# Patient Record
Sex: Male | Born: 1960 | Race: Black or African American | Hispanic: No | Marital: Married | Smoking: Never smoker
Health system: Southern US, Community
[De-identification: ages and names within clinical notes are randomized; demographics above are authoritative.]

## PROBLEM LIST (undated history)

## (undated) DIAGNOSIS — K219 Gastro-esophageal reflux disease without esophagitis: Secondary | ICD-10-CM

## (undated) DIAGNOSIS — I10 Essential (primary) hypertension: Secondary | ICD-10-CM

## (undated) HISTORY — PX: CHOLECYSTECTOMY: SHX55

---

## 2017-10-02 ENCOUNTER — Encounter (HOSPITAL_BASED_OUTPATIENT_CLINIC_OR_DEPARTMENT_OTHER): Payer: Self-pay | Admitting: Emergency Medicine

## 2017-10-02 DIAGNOSIS — I10 Essential (primary) hypertension: Secondary | ICD-10-CM | POA: Insufficient documentation

## 2017-10-02 DIAGNOSIS — Z79899 Other long term (current) drug therapy: Secondary | ICD-10-CM | POA: Diagnosis not present

## 2017-10-02 DIAGNOSIS — R0789 Other chest pain: Secondary | ICD-10-CM | POA: Diagnosis present

## 2017-10-02 DIAGNOSIS — K567 Ileus, unspecified: Secondary | ICD-10-CM | POA: Diagnosis not present

## 2017-10-02 NOTE — ED Triage Notes (Addendum)
PT presents with c/o abdominal pain since 3pm today. No n/v/d. Pt states he has not taken zantac in a couple weeks.

## 2017-10-03 ENCOUNTER — Emergency Department (HOSPITAL_BASED_OUTPATIENT_CLINIC_OR_DEPARTMENT_OTHER)
Admission: EM | Admit: 2017-10-03 | Discharge: 2017-10-03 | Disposition: A | Discharge status: Home / Self Care | Payer: Medicaid - Out of State | Attending: Physician Assistant | Admitting: Physician Assistant

## 2017-10-03 ENCOUNTER — Emergency Department (HOSPITAL_BASED_OUTPATIENT_CLINIC_OR_DEPARTMENT_OTHER): Payer: Medicaid - Out of State

## 2017-10-03 DIAGNOSIS — K567 Ileus, unspecified: Secondary | ICD-10-CM

## 2017-10-03 HISTORY — DX: Essential (primary) hypertension: I10

## 2017-10-03 LAB — COMPREHENSIVE METABOLIC PANEL
ALT: 35 U/L (ref 17–63)
ANION GAP: 9 (ref 5–15)
AST: 29 U/L (ref 15–41)
Albumin: 4.6 g/dL (ref 3.5–5.0)
Alkaline Phosphatase: 51 U/L (ref 38–126)
BILIRUBIN TOTAL: 0.5 mg/dL (ref 0.3–1.2)
BUN: 11 mg/dL (ref 6–20)
CALCIUM: 9.7 mg/dL (ref 8.9–10.3)
CO2: 28 mmol/L (ref 22–32)
Chloride: 101 mmol/L (ref 101–111)
Creatinine, Ser: 1.05 mg/dL (ref 0.61–1.24)
GFR calc Af Amer: >60 mL/min (ref >60)
Glucose, Bld: 142 mg/dL — ABNORMAL HIGH (ref 65–99)
POTASSIUM: 3.7 mmol/L (ref 3.5–5.1)
SODIUM: 138 mmol/L (ref 135–145)
TOTAL PROTEIN: 8.7 g/dL — AB (ref 6.5–8.1)

## 2017-10-03 LAB — CBC WITH DIFFERENTIAL/PLATELET
BASOS ABS: 0 10*3/uL (ref 0.0–0.1)
BASOS PCT: 0 %
EOS PCT: 0 %
Eosinophils Absolute: 0 10*3/uL (ref 0.0–0.7)
HEMATOCRIT: 43.8 % (ref 39.0–52.0)
Hemoglobin: 14.2 g/dL (ref 13.0–17.0)
LYMPHS PCT: 30 %
Lymphs Abs: 4.3 10*3/uL — ABNORMAL HIGH (ref 0.7–4.0)
MCH: 28.7 pg (ref 26.0–34.0)
MCHC: 32.4 g/dL (ref 30.0–36.0)
MCV: 88.7 fL (ref 78.0–100.0)
Monocytes Absolute: 1.1 10*3/uL — ABNORMAL HIGH (ref 0.1–1.0)
Monocytes Relative: 7 %
NEUTROS ABS: 8.9 10*3/uL — AB (ref 1.7–7.7)
Neutrophils Relative %: 63 %
PLATELETS: 406 10*3/uL — AB (ref 150–400)
RBC: 4.94 MIL/uL (ref 4.22–5.81)
RDW: 11.9 % (ref 11.5–15.5)
WBC: 14.3 10*3/uL — AB (ref 4.0–10.5)

## 2017-10-03 LAB — LIPASE, BLOOD: Lipase: 30 U/L (ref 11–51)

## 2017-10-03 MED ORDER — ONDANSETRON HCL 4 MG/2ML IJ SOLN
INTRAMUSCULAR | Status: AC
  Administered 2017-10-03: 4 mg via INTRAVENOUS
  Filled 2017-10-03: qty 2

## 2017-10-03 MED ORDER — ONDANSETRON HCL 4 MG/2ML IJ SOLN
4.0000 mg | Freq: Once | INTRAMUSCULAR | Status: AC
  Administered 2017-10-03: 4 mg via INTRAVENOUS

## 2017-10-03 MED ORDER — SODIUM CHLORIDE 0.9 % IV BOLUS (SEPSIS)
1000.0000 mL | Freq: Once | INTRAVENOUS | Status: AC
  Administered 2017-10-03: 1000 mL via INTRAVENOUS

## 2017-10-03 MED ORDER — PANTOPRAZOLE SODIUM 40 MG IV SOLR
40.0000 mg | Freq: Once | INTRAVENOUS | Status: AC
  Administered 2017-10-03: 40 mg via INTRAVENOUS

## 2017-10-03 MED ORDER — FENTANYL CITRATE (PF) 100 MCG/2ML IJ SOLN
25.0000 ug | Freq: Once | INTRAMUSCULAR | Status: AC
  Administered 2017-10-03: 25 ug via INTRAVENOUS
  Filled 2017-10-03: qty 2

## 2017-10-03 MED ORDER — IOPAMIDOL (ISOVUE-300) INJECTION 61%
100.0000 mL | Freq: Once | INTRAVENOUS | Status: AC | PRN
  Administered 2017-10-03: 100 mL via INTRAVENOUS

## 2017-10-03 MED ORDER — PANTOPRAZOLE SODIUM 40 MG IV SOLR
INTRAVENOUS | Status: AC
  Administered 2017-10-03: 40 mg via INTRAVENOUS
  Filled 2017-10-03: qty 40

## 2017-10-03 NOTE — ED Notes (Signed)
Tolerating POs, denies pain, "feel better".
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
## 2017-10-03 NOTE — Discharge Instructions (Signed)
We think that you have a  ileus today.  However we cannot exclude that you have complete obstruction.  You are able to tolerate liquids here which is a great sign.  If you start vomiting again with increased pain please return immediately to the emergency department as you may need surgery.  Otherwise please advance your liquid diet slowly as tolerated.
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
## 2017-10-03 NOTE — ED Notes (Signed)
No changes, alert, NAD, calm, to CT.

## 2019-02-09 IMAGING — CT CT ABD-PELV W/ CM
2 of 5 series · 15 of 46 positions shown, 17 images · IV contrast (APPLIED)
Comparison: None.

CLINICAL DATA: Acute onset of generalized abdominal pain and
leukocytosis. Initial encounter.

EXAM:
CT ABDOMEN AND PELVIS WITH CONTRAST
TECHNIQUE: Multidetector CT imaging of the abdomen and pelvis was performed
using the standard protocol following bolus administration of
intravenous contrast.
CONTRAST:  100mL [id number]-SGG IOPAMIDOL (16I5TQ-SGG) INJECTION 61%

[Series 2: axial st · axial · 0.82mm/px · z∈[+741,+1141]mm · 12 of 92 slices shown, 14 images]
[im 6/92  soft-tissue]
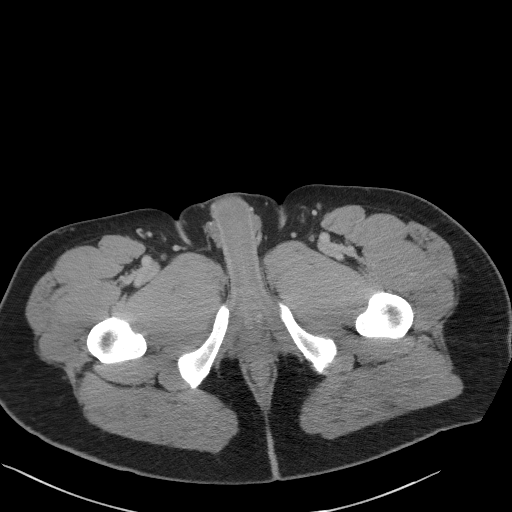
[im 6/92  bone]
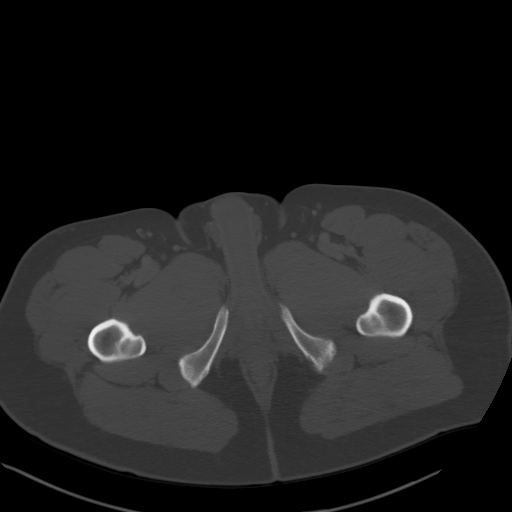
[im 16/92  soft-tissue]
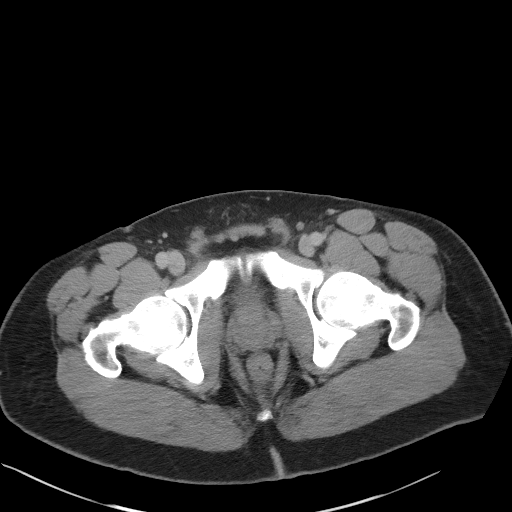
[im 21/92  soft-tissue]
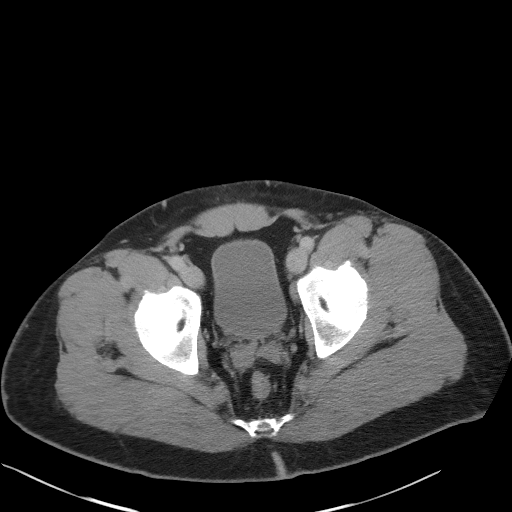
[im 26/92  soft-tissue]
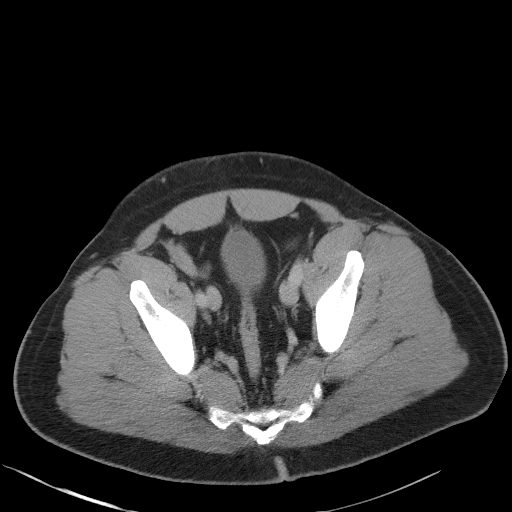
[im 36/92  soft-tissue]
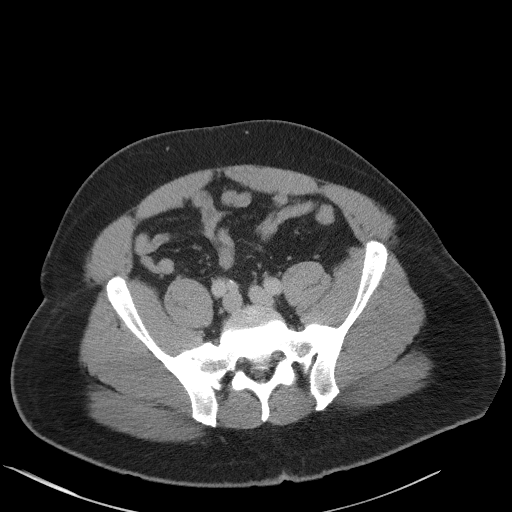
[im 41/92  soft-tissue]
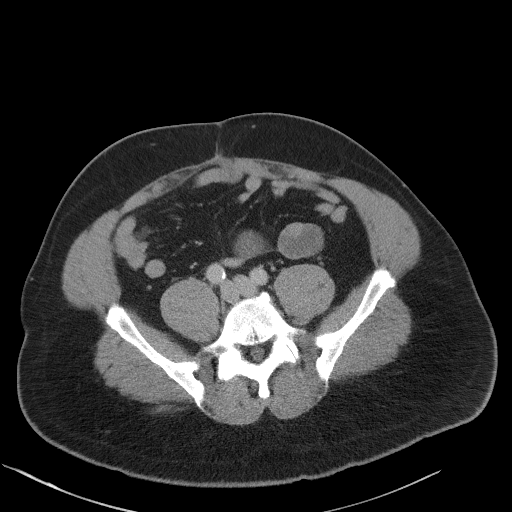
[im 51/92  soft-tissue]
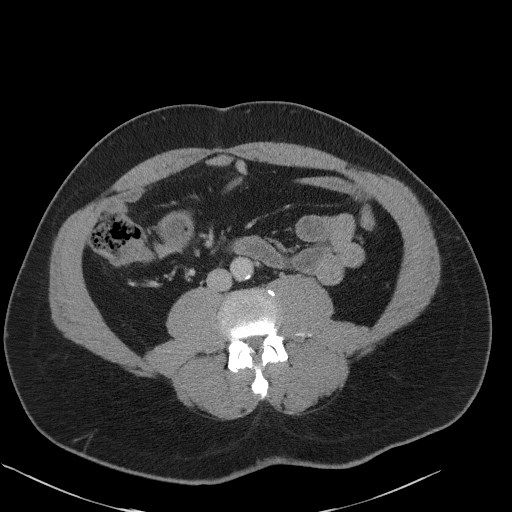
[im 56/92  soft-tissue]
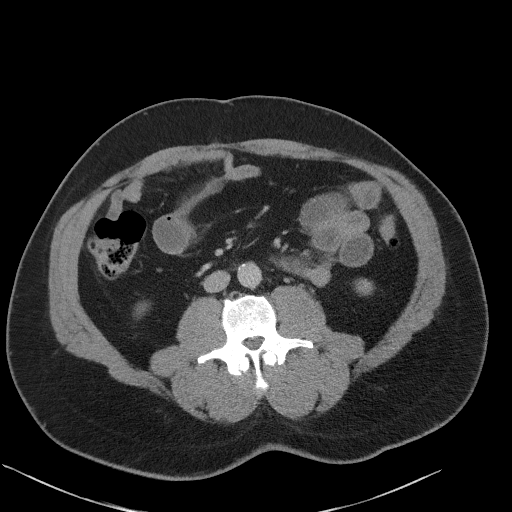
[im 66/92  soft-tissue]
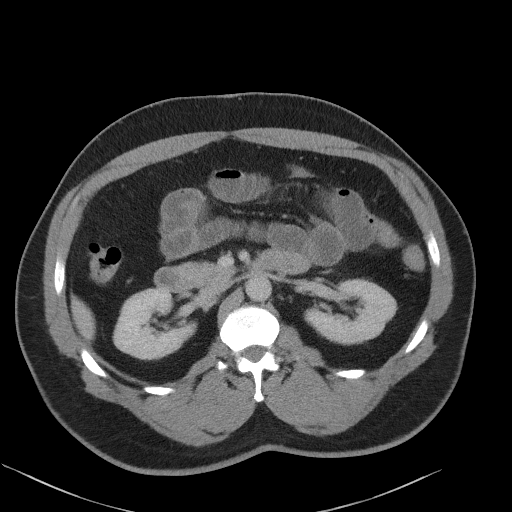
[im 66/92  bone]
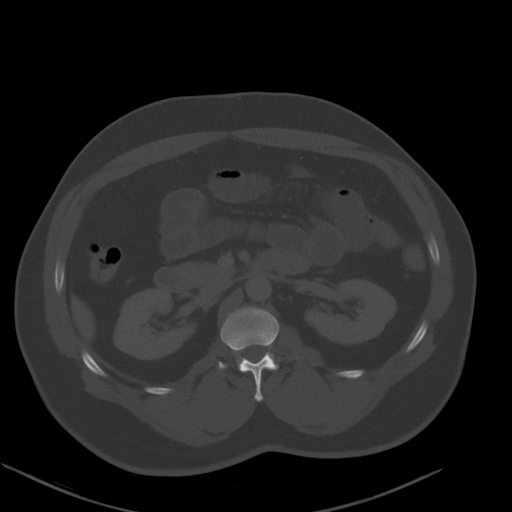
[im 71/92  soft-tissue]
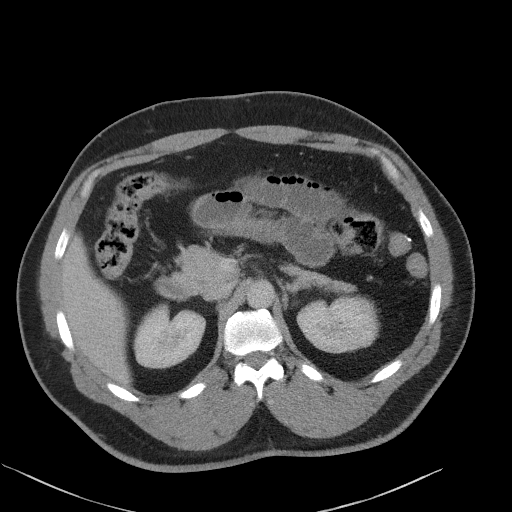
[im 76/92  soft-tissue]
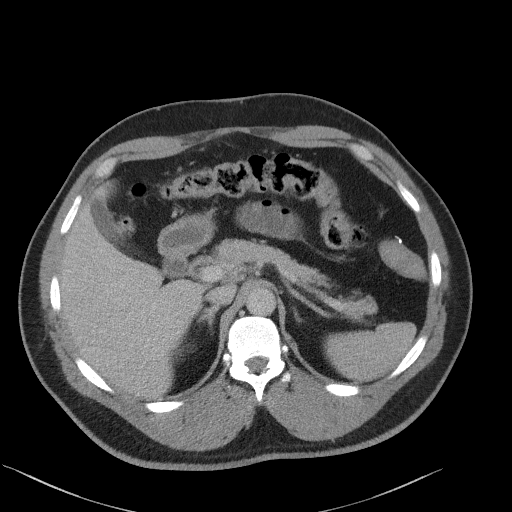
[im 86/92  soft-tissue]
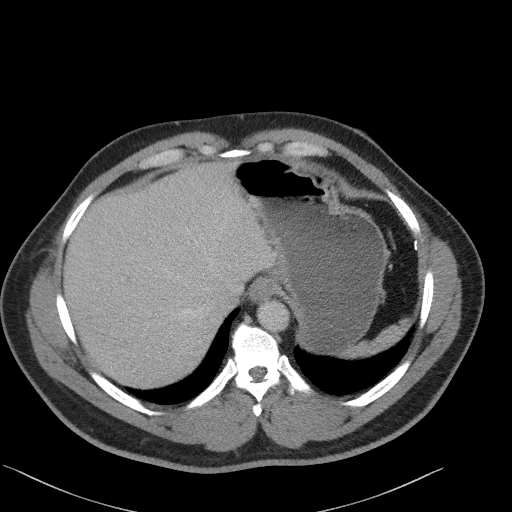

[Series 5: coronal st · coronal · 0.80mm/px · 3 of 107 slices shown]
[im 36/107  soft-tissue]
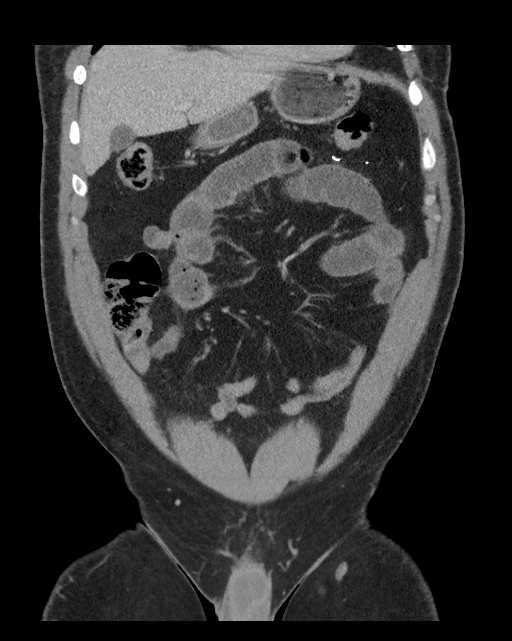
[im 48/107  soft-tissue]
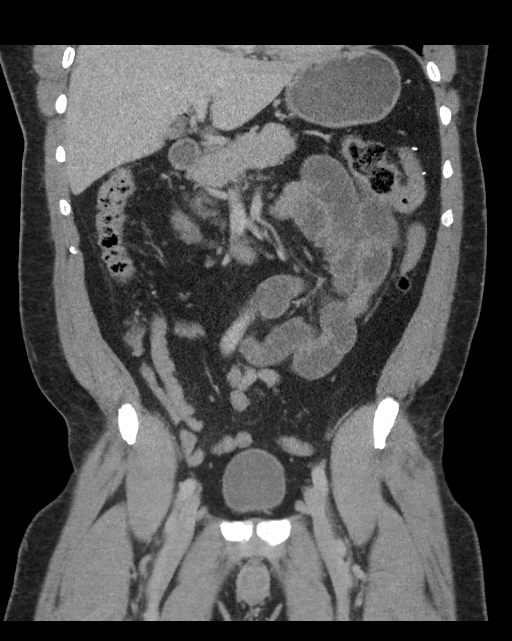
[im 59/107  soft-tissue]
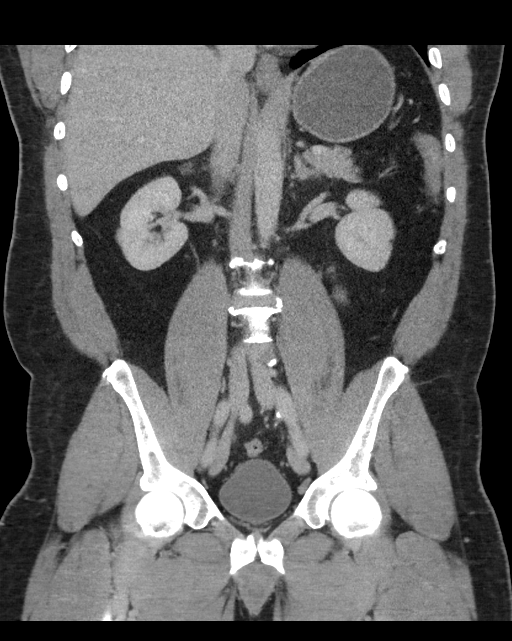

[15 of 46 positions shown; findings below may reference images not displayed]

FINDINGS: Lower chest: The visualized lung bases are grossly clear. The
visualized portions of the mediastinum are unremarkable.

Hepatobiliary: The visualized portions of the liver are
unremarkable. Small stones are seen within the gallbladder. The
gallbladder is otherwise unremarkable. The common bile duct remains
normal in caliber.

Pancreas: The pancreas is within normal limits.

Spleen: The spleen is unremarkable in appearance.

Adrenals/Urinary Tract: The adrenal glands are unremarkable in
appearance. The kidneys are within normal limits. There is no
evidence of hydronephrosis. No renal or ureteral stones are
identified. No perinephric stranding is seen.

Stomach/Bowel: The stomach is unremarkable in appearance. The
appendix is normal in caliber, without evidence of appendicitis. The
colon is unremarkable in appearance.

There is borderline distention of small-bowel loops to 3.2 cm in
maximal diameter, with focal fecalization at the level of the
proximal to mid ileum, and distal decompression of small bowel
loops. This is concerning for some degree of small bowel dysmotility
or possibly partial small bowel obstruction due to ileitis. Mild
associated ileal wall thickening and trace associated free fluid are
noted.

Vascular/Lymphatic: Scattered calcification is seen along the distal
abdominal aorta and its branches. The abdominal aorta is otherwise
grossly unremarkable. The inferior vena cava is grossly
unremarkable. No retroperitoneal lymphadenopathy is seen. No pelvic
sidewall lymphadenopathy is identified.

Reproductive: The bladder is mildly distended and grossly
unremarkable. The prostate remains normal in size.

Other: No additional soft tissue abnormalities are seen.

Musculoskeletal: No acute osseous abnormalities are identified. The
visualized musculature is unremarkable in appearance.
IMPRESSION: 1. Borderline distention of small-bowel loops to 3.2 cm in maximal
diameter, with focal fecalization at the proximal to mid ileum, and
distal decompression of small bowel loops. This is concerning for
some degree of small bowel dysmotility or possibly partial small
bowel obstruction due to ileitis. Mild associated ileal wall
thickening and trace associated free fluid noted.
2. Cholelithiasis.  Gallbladder otherwise unremarkable.
3. Scattered aortic atherosclerosis.

## 2020-02-10 ENCOUNTER — Emergency Department (INDEPENDENT_AMBULATORY_CARE_PROVIDER_SITE_OTHER)
Admission: EM | Admit: 2020-02-10 | Discharge: 2020-02-10 | Disposition: A | Discharge status: Home / Self Care | Payer: Medicaid - Out of State | Source: Home / Self Care

## 2020-02-10 ENCOUNTER — Other Ambulatory Visit: Payer: Self-pay

## 2020-02-10 ENCOUNTER — Encounter: Payer: Self-pay | Admitting: Emergency Medicine

## 2020-02-10 DIAGNOSIS — R197 Diarrhea, unspecified: Secondary | ICD-10-CM | POA: Diagnosis not present

## 2020-02-10 DIAGNOSIS — R000 Tachycardia, unspecified: Secondary | ICD-10-CM

## 2020-02-10 DIAGNOSIS — R072 Precordial pain: Secondary | ICD-10-CM | POA: Diagnosis not present

## 2020-02-10 HISTORY — DX: Gastro-esophageal reflux disease without esophagitis: K21.9

## 2020-02-10 MED ORDER — FAMOTIDINE 20 MG PO TABS: 20.0000 mg | ORAL_TABLET | Freq: Once | ORAL | Status: DC

## 2020-02-10 NOTE — ED Notes (Signed)
Pt instructed to go to 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 Pt's O2 sat 96-97%  w/ HR 110-116 ambulating. Pt verbalized an understanding that he needed to go straight to the ED for further tests. 12 lead EKG in chart.
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
## 2020-02-10 NOTE — Discharge Instructions (Addendum)
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 It is advised you continue onto the emergency department for further testing.  This can typically be done by a simple blood test, imaging may be ordered depending on the results of the blood work.   Try to stay well hydrated and rest.  Be sure to take your acid reflux medication and all other medications as prescribed.  Please follow up with your family doctor on Monday if symptoms not improving.
